# Patient Record
Sex: Male | Born: 1980 | Race: White | Hispanic: Yes | Smoking: Never smoker
Health system: Southern US, Community
[De-identification: ages and names within clinical notes are randomized; demographics above are authoritative.]

## PROBLEM LIST (undated history)

## (undated) DIAGNOSIS — M1990 Unspecified osteoarthritis, unspecified site: Secondary | ICD-10-CM

---

## 2020-10-13 ENCOUNTER — Encounter (HOSPITAL_COMMUNITY): Payer: Self-pay | Admitting: Emergency Medicine

## 2020-10-13 ENCOUNTER — Inpatient Hospital Stay (HOSPITAL_COMMUNITY)
Admission: EM | Admit: 2020-10-13 | Discharge: 2020-10-19 | DRG: 683 | Disposition: A | Discharge status: Home / Self Care | Payer: Self-pay | Attending: Internal Medicine | Admitting: Internal Medicine

## 2020-10-13 ENCOUNTER — Other Ambulatory Visit: Payer: Self-pay

## 2020-10-13 ENCOUNTER — Emergency Department (HOSPITAL_COMMUNITY): Payer: Self-pay

## 2020-10-13 DIAGNOSIS — R609 Edema, unspecified: Secondary | ICD-10-CM

## 2020-10-13 DIAGNOSIS — D72829 Elevated white blood cell count, unspecified: Secondary | ICD-10-CM | POA: Diagnosis not present

## 2020-10-13 DIAGNOSIS — M25562 Pain in left knee: Secondary | ICD-10-CM

## 2020-10-13 DIAGNOSIS — A419 Sepsis, unspecified organism: Secondary | ICD-10-CM

## 2020-10-13 DIAGNOSIS — D509 Iron deficiency anemia, unspecified: Secondary | ICD-10-CM | POA: Diagnosis present

## 2020-10-13 DIAGNOSIS — W3400XA Accidental discharge from unspecified firearms or gun, initial encounter: Secondary | ICD-10-CM

## 2020-10-13 DIAGNOSIS — N179 Acute kidney failure, unspecified: Principal | ICD-10-CM | POA: Diagnosis present

## 2020-10-13 DIAGNOSIS — R739 Hyperglycemia, unspecified: Secondary | ICD-10-CM | POA: Diagnosis present

## 2020-10-13 DIAGNOSIS — Z23 Encounter for immunization: Secondary | ICD-10-CM

## 2020-10-13 DIAGNOSIS — S71142A Puncture wound with foreign body, left thigh, initial encounter: Secondary | ICD-10-CM | POA: Diagnosis present

## 2020-10-13 DIAGNOSIS — M6282 Rhabdomyolysis: Secondary | ICD-10-CM | POA: Diagnosis present

## 2020-10-13 DIAGNOSIS — M1990 Unspecified osteoarthritis, unspecified site: Secondary | ICD-10-CM | POA: Diagnosis present

## 2020-10-13 DIAGNOSIS — Z20822 Contact with and (suspected) exposure to covid-19: Secondary | ICD-10-CM | POA: Diagnosis present

## 2020-10-13 DIAGNOSIS — S81832A Puncture wound without foreign body, left lower leg, initial encounter: Secondary | ICD-10-CM | POA: Diagnosis present

## 2020-10-13 DIAGNOSIS — Z791 Long term (current) use of non-steroidal anti-inflammatories (NSAID): Secondary | ICD-10-CM

## 2020-10-13 DIAGNOSIS — E860 Dehydration: Secondary | ICD-10-CM | POA: Diagnosis present

## 2020-10-13 DIAGNOSIS — Z6830 Body mass index (BMI) 30.0-30.9, adult: Secondary | ICD-10-CM

## 2020-10-13 DIAGNOSIS — E872 Acidosis: Secondary | ICD-10-CM | POA: Diagnosis present

## 2020-10-13 DIAGNOSIS — E876 Hypokalemia: Secondary | ICD-10-CM | POA: Diagnosis present

## 2020-10-13 HISTORY — DX: Unspecified osteoarthritis, unspecified site: M19.90

## 2020-10-13 MED ORDER — ONDANSETRON HCL 4 MG/2ML IJ SOLN
4.0000 mg | Freq: Once | INTRAMUSCULAR | Status: AC
  Administered 2020-10-14: 4 mg via INTRAVENOUS
  Filled 2020-10-13: qty 2

## 2020-10-13 MED ORDER — TETANUS-DIPHTH-ACELL PERTUSSIS 5-2.5-18.5 LF-MCG/0.5 IM SUSY
0.5000 mL | PREFILLED_SYRINGE | Freq: Once | INTRAMUSCULAR | Status: AC
  Administered 2020-10-14: 0.5 mL via INTRAMUSCULAR
  Filled 2020-10-13: qty 0.5

## 2020-10-13 MED ORDER — SODIUM CHLORIDE 0.9 % IV BOLUS
500.0000 mL | Freq: Once | INTRAVENOUS | Status: AC
  Administered 2020-10-13: 500 mL via INTRAVENOUS

## 2020-10-13 MED ORDER — CEFAZOLIN SODIUM-DEXTROSE 1-4 GM/50ML-% IV SOLN
1.0000 g | Freq: Once | INTRAVENOUS | Status: AC
  Administered 2020-10-14: 1 g via INTRAVENOUS
  Filled 2020-10-13: qty 50

## 2020-10-13 MED ORDER — FENTANYL CITRATE PF 50 MCG/ML IJ SOSY
100.0000 ug | PREFILLED_SYRINGE | INTRAMUSCULAR | Status: DC | PRN
  Filled 2020-10-13: qty 2
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
## 2020-10-13 NOTE — Progress Notes (Signed)
Chaplain checked in for level.  Chaplain available to follow-up as needed.    10/13/20 2300  Clinical Encounter Type  Visited With Patient not available  Visit Type Initial

## 2020-10-14 ENCOUNTER — Emergency Department (HOSPITAL_COMMUNITY): Payer: Self-pay

## 2020-10-14 ENCOUNTER — Encounter (HOSPITAL_COMMUNITY): Payer: Self-pay | Admitting: Internal Medicine

## 2020-10-14 ENCOUNTER — Inpatient Hospital Stay (HOSPITAL_COMMUNITY): Payer: Self-pay

## 2020-10-14 DIAGNOSIS — N179 Acute kidney failure, unspecified: Secondary | ICD-10-CM | POA: Diagnosis present

## 2020-10-14 LAB — CBC WITH DIFFERENTIAL/PLATELET
Abs Immature Granulocytes: 0.1 10*3/uL — ABNORMAL HIGH (ref 0.00–0.07)
Basophils Absolute: 0.1 10*3/uL (ref 0.0–0.1)
Basophils Relative: 1 %
Eosinophils Absolute: 0.6 10*3/uL — ABNORMAL HIGH (ref 0.0–0.5)
Eosinophils Relative: 4 %
HCT: 27.7 % — ABNORMAL LOW (ref 39.0–52.0)
Hemoglobin: 9.3 g/dL — ABNORMAL LOW (ref 13.0–17.0)
Immature Granulocytes: 1 %
Lymphocytes Relative: 42 %
Lymphs Abs: 7.1 10*3/uL — ABNORMAL HIGH (ref 0.7–4.0)
MCH: 31.3 pg (ref 26.0–34.0)
MCHC: 33.6 g/dL (ref 30.0–36.0)
MCV: 93.3 fL (ref 80.0–100.0)
Monocytes Absolute: 1.4 10*3/uL — ABNORMAL HIGH (ref 0.1–1.0)
Monocytes Relative: 8 %
Neutro Abs: 7.5 10*3/uL (ref 1.7–7.7)
Neutrophils Relative %: 44 %
Platelets: 358 10*3/uL (ref 150–400)
RBC: 2.97 MIL/uL — ABNORMAL LOW (ref 4.22–5.81)
RDW: 12.7 % (ref 11.5–15.5)
WBC: 16.7 10*3/uL — ABNORMAL HIGH (ref 4.0–10.5)
nRBC: 0 % (ref 0.0–0.2)

## 2020-10-14 LAB — COMPREHENSIVE METABOLIC PANEL
ALT: 26 U/L (ref 0–44)
ALT: 26 U/L (ref 0–44)
AST: 45 U/L — ABNORMAL HIGH (ref 15–41)
AST: 47 U/L — ABNORMAL HIGH (ref 15–41)
Albumin: 3.5 g/dL (ref 3.5–5.0)
Albumin: 3.1 g/dL — ABNORMAL LOW (ref 3.5–5.0)
Alkaline Phosphatase: 64 U/L (ref 38–126)
Alkaline Phosphatase: 65 U/L (ref 38–126)
Anion gap: 13 (ref 5–15)
Anion gap: 9 (ref 5–15)
BUN: 65 mg/dL — ABNORMAL HIGH (ref 6–20)
BUN: 58 mg/dL — ABNORMAL HIGH (ref 6–20)
CO2: 18 mmol/L — ABNORMAL LOW (ref 22–32)
CO2: 21 mmol/L — ABNORMAL LOW (ref 22–32)
Calcium: 8.8 mg/dL — ABNORMAL LOW (ref 8.9–10.3)
Calcium: 8.7 mg/dL — ABNORMAL LOW (ref 8.9–10.3)
Chloride: 106 mmol/L (ref 98–111)
Chloride: 106 mmol/L (ref 98–111)
Creatinine, Ser: 3.45 mg/dL — ABNORMAL HIGH (ref 0.61–1.24)
Creatinine, Ser: 3.09 mg/dL — ABNORMAL HIGH (ref 0.61–1.24)
GFR, Estimated: 11 mL/min — ABNORMAL LOW (ref >60)
GFR, Estimated: 25 mL/min — ABNORMAL LOW (ref >60)
Glucose, Bld: 131 mg/dL — ABNORMAL HIGH (ref 70–99)
Glucose, Bld: 141 mg/dL — ABNORMAL HIGH (ref 70–99)
Potassium: 3.2 mmol/L — ABNORMAL LOW (ref 3.5–5.1)
Potassium: 3.2 mmol/L — ABNORMAL LOW (ref 3.5–5.1)
Sodium: 137 mmol/L (ref 135–145)
Sodium: 136 mmol/L (ref 135–145)
Total Bilirubin: 0.3 mg/dL (ref 0.3–1.2)
Total Bilirubin: 0.5 mg/dL (ref 0.3–1.2)
Total Protein: 7.8 g/dL (ref 6.5–8.1)
Total Protein: 7 g/dL (ref 6.5–8.1)

## 2020-10-14 LAB — I-STAT CHEM 8, ED
BUN: 79 mg/dL — ABNORMAL HIGH (ref 6–20)
Calcium, Ion: 1.16 mmol/L (ref 1.15–1.40)
Chloride: 108 mmol/L (ref 98–111)
Creatinine, Ser: 3.9 mg/dL — ABNORMAL HIGH (ref 0.61–1.24)
Glucose, Bld: 128 mg/dL — ABNORMAL HIGH (ref 70–99)
HCT: 28 % — ABNORMAL LOW (ref 39.0–52.0)
Hemoglobin: 9.5 g/dL — ABNORMAL LOW (ref 13.0–17.0)
Potassium: 3.2 mmol/L — ABNORMAL LOW (ref 3.5–5.1)
Sodium: 140 mmol/L (ref 135–145)
TCO2: 21 mmol/L — ABNORMAL LOW (ref 22–32)

## 2020-10-14 LAB — URINALYSIS, ROUTINE W REFLEX MICROSCOPIC
Bacteria, UA: NONE SEEN
Bilirubin Urine: NEGATIVE
Glucose, UA: NEGATIVE mg/dL
Ketones, ur: NEGATIVE mg/dL
Leukocytes,Ua: NEGATIVE
Nitrite: NEGATIVE
Protein, ur: NEGATIVE mg/dL
Specific Gravity, Urine: 1.01 (ref 1.005–1.030)
pH: 5 (ref 5.0–8.0)

## 2020-10-14 LAB — IRON AND TIBC
Iron: 26 ug/dL — ABNORMAL LOW (ref 45–182)
Saturation Ratios: 8 % — ABNORMAL LOW (ref 17.9–39.5)
TIBC: 325 ug/dL (ref 250–450)
UIBC: 299 ug/dL

## 2020-10-14 LAB — CBC
HCT: 24.8 % — ABNORMAL LOW (ref 39.0–52.0)
Hemoglobin: 8 g/dL — ABNORMAL LOW (ref 13.0–17.0)
MCH: 30 pg (ref 26.0–34.0)
MCHC: 32.3 g/dL (ref 30.0–36.0)
MCV: 92.9 fL (ref 80.0–100.0)
Platelets: 303 10*3/uL (ref 150–400)
RBC: 2.67 MIL/uL — ABNORMAL LOW (ref 4.22–5.81)
RDW: 12.8 % (ref 11.5–15.5)
WBC: 13.1 10*3/uL — ABNORMAL HIGH (ref 4.0–10.5)
nRBC: 0 % (ref 0.0–0.2)

## 2020-10-14 LAB — SODIUM, URINE, RANDOM: Sodium, Ur: 109 mmol/L

## 2020-10-14 LAB — CK
Total CK: 1018 U/L — ABNORMAL HIGH (ref 49–397)
Total CK: 1024 U/L — ABNORMAL HIGH (ref 49–397)

## 2020-10-14 LAB — RESP PANEL BY RT-PCR (FLU A&B, COVID) ARPGX2
Influenza A by PCR: NEGATIVE
Influenza B by PCR: NEGATIVE
SARS Coronavirus 2 by RT PCR: NEGATIVE

## 2020-10-14 LAB — FERRITIN: Ferritin: 270 ng/mL (ref 24–336)

## 2020-10-14 LAB — OSMOLALITY, URINE: Osmolality, Ur: 420 mOsm/kg (ref 300–900)

## 2020-10-14 LAB — ABO/RH: ABO/RH(D): A POS

## 2020-10-14 LAB — TYPE AND SCREEN
ABO/RH(D): A POS
Antibody Screen: NEGATIVE

## 2020-10-14 LAB — PROTIME-INR
INR: 1 (ref 0.8–1.2)
Prothrombin Time: 12.9 seconds (ref 11.4–15.2)

## 2020-10-14 LAB — HIV ANTIBODY (ROUTINE TESTING W REFLEX): HIV Screen 4th Generation wRfx: NONREACTIVE

## 2020-10-14 LAB — PHOSPHORUS: Phosphorus: 3 mg/dL (ref 2.5–4.6)

## 2020-10-14 LAB — MAGNESIUM: Magnesium: 1.8 mg/dL (ref 1.7–2.4)

## 2020-10-14 MED ORDER — LACTATED RINGERS IV BOLUS
1000.0000 mL | Freq: Once | INTRAVENOUS | Status: AC
  Administered 2020-10-14: 1000 mL via INTRAVENOUS

## 2020-10-14 MED ORDER — HYDROMORPHONE HCL 1 MG/ML IJ SOLN
1.0000 mg | INTRAMUSCULAR | Status: DC | PRN
Start: 2020-10-14 — End: 2020-10-15
  Administered 2020-10-14 – 2020-10-15 (×3): 1 mg via INTRAVENOUS
  Filled 2020-10-14 (×3): qty 1

## 2020-10-14 MED ORDER — LACTATED RINGERS IV SOLN: INTRAVENOUS | Status: AC

## 2020-10-14 MED ORDER — MELATONIN 3 MG PO TABS
3.0000 mg | ORAL_TABLET | Freq: Every evening | ORAL | Status: DC | PRN
  Administered 2020-10-14 – 2020-10-15 (×2): 3 mg via ORAL
  Filled 2020-10-14 (×2): qty 1

## 2020-10-14 MED ORDER — POTASSIUM CHLORIDE CRYS ER 20 MEQ PO TBCR
30.0000 meq | EXTENDED_RELEASE_TABLET | Freq: Once | ORAL | Status: AC
  Administered 2020-10-14: 30 meq via ORAL
  Filled 2020-10-14: qty 1

## 2020-10-14 MED ORDER — ACETAMINOPHEN 325 MG PO TABS
650.0000 mg | ORAL_TABLET | Freq: Four times a day (QID) | ORAL | Status: AC | PRN
  Administered 2020-10-15 (×2): 650 mg via ORAL
  Filled 2020-10-14 (×2): qty 2

## 2020-10-14 MED ORDER — HYDROMORPHONE HCL 1 MG/ML IJ SOLN
1.0000 mg | INTRAMUSCULAR | Status: DC | PRN
  Administered 2020-10-14 (×2): 1 mg via INTRAVENOUS
  Filled 2020-10-14 (×2): qty 1

## 2020-10-14 MED ORDER — POLYETHYLENE GLYCOL 3350 17 G PO PACK: 17.0000 g | PACK | Freq: Every day | ORAL | Status: DC | PRN

## 2020-10-14 MED ORDER — CEFAZOLIN SODIUM-DEXTROSE 1-4 GM/50ML-% IV SOLN
1.0000 g | Freq: Two times a day (BID) | INTRAVENOUS | Status: DC
  Administered 2020-10-14 (×2): 1 g via INTRAVENOUS
  Filled 2020-10-14 (×4): qty 50

## 2020-10-14 MED ORDER — ONDANSETRON HCL 4 MG/2ML IJ SOLN
4.0000 mg | Freq: Four times a day (QID) | INTRAMUSCULAR | Status: DC | PRN
  Filled 2020-10-14: qty 2

## 2020-10-14 MED ORDER — SODIUM CHLORIDE 0.9 % IV BOLUS
2500.0000 mL | Freq: Once | INTRAVENOUS | Status: AC
  Administered 2020-10-14: 2500 mL via INTRAVENOUS
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
## 2020-10-14 NOTE — ED Notes (Signed)
Patient transported to CT scan . 
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
## 2020-10-14 NOTE — Progress Notes (Signed)
Waiting for ED nurse to respond to my questions regarding report for this patient. Have not heard back yet via secure chat. This information was passed along to night shift RN, Crystal and she was added to secure chat conversation.

## 2020-10-14 NOTE — ED Notes (Signed)
Patient eating lucnh at this time, denied any pain or needs at this time.

## 2020-10-15 ENCOUNTER — Inpatient Hospital Stay (HOSPITAL_COMMUNITY): Payer: Self-pay

## 2020-10-15 DIAGNOSIS — W3400XA Accidental discharge from unspecified firearms or gun, initial encounter: Secondary | ICD-10-CM

## 2020-10-15 DIAGNOSIS — A419 Sepsis, unspecified organism: Secondary | ICD-10-CM

## 2020-10-15 DIAGNOSIS — M1990 Unspecified osteoarthritis, unspecified site: Secondary | ICD-10-CM

## 2020-10-15 LAB — CK: Total CK: 943 U/L — ABNORMAL HIGH (ref 49–397)

## 2020-10-15 LAB — SYNOVIAL CELL COUNT + DIFF, W/ CRYSTALS
Crystals, Fluid: NONE SEEN
Eosinophils-Synovial: 0 % (ref 0–1)
Lymphocytes-Synovial Fld: 2 % (ref 0–20)
Monocyte-Macrophage-Synovial Fluid: 3 % — ABNORMAL LOW (ref 50–90)
Neutrophil, Synovial: 95 % — ABNORMAL HIGH (ref 0–25)
WBC, Synovial: 29000 /mm3 — ABNORMAL HIGH (ref 0–200)

## 2020-10-15 LAB — BASIC METABOLIC PANEL
Anion gap: 8 (ref 5–15)
BUN: 44 mg/dL — ABNORMAL HIGH (ref 6–20)
CO2: 24 mmol/L (ref 22–32)
Calcium: 8.7 mg/dL — ABNORMAL LOW (ref 8.9–10.3)
Chloride: 103 mmol/L (ref 98–111)
Creatinine, Ser: 2.69 mg/dL — ABNORMAL HIGH (ref 0.61–1.24)
GFR, Estimated: 30 mL/min — ABNORMAL LOW (ref >60)
Glucose, Bld: 126 mg/dL — ABNORMAL HIGH (ref 70–99)
Potassium: 3.8 mmol/L (ref 3.5–5.1)
Sodium: 135 mmol/L (ref 135–145)

## 2020-10-15 LAB — CBC WITH DIFFERENTIAL/PLATELET
Abs Immature Granulocytes: 0.07 10*3/uL (ref 0.00–0.07)
Basophils Absolute: 0.1 10*3/uL (ref 0.0–0.1)
Basophils Relative: 1 %
Eosinophils Absolute: 0.1 10*3/uL (ref 0.0–0.5)
Eosinophils Relative: 1 %
HCT: 23.3 % — ABNORMAL LOW (ref 39.0–52.0)
Hemoglobin: 7.7 g/dL — ABNORMAL LOW (ref 13.0–17.0)
Immature Granulocytes: 1 %
Lymphocytes Relative: 16 %
Lymphs Abs: 2.2 10*3/uL (ref 0.7–4.0)
MCH: 30.1 pg (ref 26.0–34.0)
MCHC: 33 g/dL (ref 30.0–36.0)
MCV: 91 fL (ref 80.0–100.0)
Monocytes Absolute: 1.6 10*3/uL — ABNORMAL HIGH (ref 0.1–1.0)
Monocytes Relative: 11 %
Neutro Abs: 10.1 10*3/uL — ABNORMAL HIGH (ref 1.7–7.7)
Neutrophils Relative %: 70 %
Platelets: 240 10*3/uL (ref 150–400)
RBC: 2.56 MIL/uL — ABNORMAL LOW (ref 4.22–5.81)
RDW: 13.2 % (ref 11.5–15.5)
WBC: 14.1 10*3/uL — ABNORMAL HIGH (ref 4.0–10.5)
nRBC: 0 % (ref 0.0–0.2)

## 2020-10-15 MED ORDER — VANCOMYCIN HCL 1750 MG/350ML IV SOLN
1750.0000 mg | Freq: Once | INTRAVENOUS | Status: AC
  Administered 2020-10-15: 1750 mg via INTRAVENOUS
  Filled 2020-10-15: qty 350

## 2020-10-15 MED ORDER — VANCOMYCIN HCL 750 MG/150ML IV SOLN: 750.0000 mg | INTRAVENOUS | Status: DC

## 2020-10-15 MED ORDER — SODIUM CHLORIDE 0.9 % IV SOLN: INTRAVENOUS | Status: DC

## 2020-10-15 MED ORDER — VANCOMYCIN HCL 750 MG/150ML IV SOLN
750.0000 mg | INTRAVENOUS | Status: DC
  Administered 2020-10-16: 750 mg via INTRAVENOUS
  Filled 2020-10-15: qty 150

## 2020-10-15 MED ORDER — OXYCODONE HCL 5 MG PO TABS
5.0000 mg | ORAL_TABLET | ORAL | Status: DC | PRN
  Administered 2020-10-15 – 2020-10-17 (×9): 10 mg via ORAL
  Administered 2020-10-18: 5 mg via ORAL
  Administered 2020-10-18: 10 mg via ORAL
  Administered 2020-10-18: 5 mg via ORAL
  Filled 2020-10-15 (×4): qty 2
  Filled 2020-10-15: qty 1
  Filled 2020-10-15 (×4): qty 2
  Filled 2020-10-15: qty 1
  Filled 2020-10-15 (×2): qty 2
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
## 2020-10-15 NOTE — Plan of Care (Signed)
  Problem: Education: Goal: Knowledge of General Education information will improve Description Including pain rating scale, medication(s)/side effects and non-pharmacologic comfort measures Outcome: Progressing   Problem: Health Behavior/Discharge Planning: Goal: Ability to manage health-related needs will improve Outcome: Progressing   
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
## 2020-10-15 NOTE — Progress Notes (Signed)
Dr. Gerri Lins made aware that patient has remained tachycardic in the  120's-160's.

## 2020-10-16 ENCOUNTER — Encounter (HOSPITAL_COMMUNITY): Payer: Self-pay | Admitting: Internal Medicine

## 2020-10-16 DIAGNOSIS — R509 Fever, unspecified: Secondary | ICD-10-CM

## 2020-10-16 LAB — HEMOGLOBIN A1C
Hgb A1c MFr Bld: 5.5 % (ref 4.8–5.6)
Mean Plasma Glucose: 111.15 mg/dL

## 2020-10-16 LAB — CBC WITH DIFFERENTIAL/PLATELET
Abs Immature Granulocytes: 0.12 10*3/uL — ABNORMAL HIGH (ref 0.00–0.07)
Basophils Absolute: 0.1 10*3/uL (ref 0.0–0.1)
Basophils Relative: 0 %
Eosinophils Absolute: 0.1 10*3/uL (ref 0.0–0.5)
Eosinophils Relative: 1 %
HCT: 22.1 % — ABNORMAL LOW (ref 39.0–52.0)
Hemoglobin: 7.3 g/dL — ABNORMAL LOW (ref 13.0–17.0)
Immature Granulocytes: 1 %
Lymphocytes Relative: 23 %
Lymphs Abs: 3.5 10*3/uL (ref 0.7–4.0)
MCH: 30.2 pg (ref 26.0–34.0)
MCHC: 33 g/dL (ref 30.0–36.0)
MCV: 91.3 fL (ref 80.0–100.0)
Monocytes Absolute: 2.1 10*3/uL — ABNORMAL HIGH (ref 0.1–1.0)
Monocytes Relative: 14 %
Neutro Abs: 9.6 10*3/uL — ABNORMAL HIGH (ref 1.7–7.7)
Neutrophils Relative %: 61 %
Platelets: 186 10*3/uL (ref 150–400)
RBC: 2.42 MIL/uL — ABNORMAL LOW (ref 4.22–5.81)
RDW: 13.2 % (ref 11.5–15.5)
WBC: 15.5 10*3/uL — ABNORMAL HIGH (ref 4.0–10.5)
nRBC: 0 % (ref 0.0–0.2)

## 2020-10-16 LAB — BASIC METABOLIC PANEL
Anion gap: 10 (ref 5–15)
BUN: 35 mg/dL — ABNORMAL HIGH (ref 6–20)
CO2: 22 mmol/L (ref 22–32)
Calcium: 8.3 mg/dL — ABNORMAL LOW (ref 8.9–10.3)
Chloride: 100 mmol/L (ref 98–111)
Creatinine, Ser: 2.81 mg/dL — ABNORMAL HIGH (ref 0.61–1.24)
GFR, Estimated: 28 mL/min — ABNORMAL LOW (ref >60)
Glucose, Bld: 120 mg/dL — ABNORMAL HIGH (ref 70–99)
Potassium: 3.3 mmol/L — ABNORMAL LOW (ref 3.5–5.1)
Sodium: 132 mmol/L — ABNORMAL LOW (ref 135–145)

## 2020-10-16 LAB — URIC ACID: Uric Acid, Serum: 10.8 mg/dL — ABNORMAL HIGH (ref 3.7–8.6)

## 2020-10-16 LAB — CK: Total CK: 747 U/L — ABNORMAL HIGH (ref 49–397)

## 2020-10-16 MED ORDER — ACETAMINOPHEN 325 MG PO TABS
650.0000 mg | ORAL_TABLET | Freq: Four times a day (QID) | ORAL | Status: DC | PRN
  Administered 2020-10-16 (×2): 650 mg via ORAL
  Filled 2020-10-16 (×2): qty 2

## 2020-10-16 MED ORDER — POTASSIUM CHLORIDE CRYS ER 20 MEQ PO TBCR
40.0000 meq | EXTENDED_RELEASE_TABLET | Freq: Every day | ORAL | Status: DC
  Administered 2020-10-16 – 2020-10-19 (×4): 40 meq via ORAL
  Filled 2020-10-16 (×4): qty 2

## 2020-10-16 MED ORDER — HYDROMORPHONE HCL 1 MG/ML IJ SOLN
1.0000 mg | Freq: Once | INTRAMUSCULAR | Status: AC
Start: 2020-10-16 — End: 2020-10-16
  Administered 2020-10-16: 1 mg via INTRAVENOUS
  Filled 2020-10-16: qty 1
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
## 2020-10-16 NOTE — Progress Notes (Signed)
HOSPITAL MEDICINE OVERNIGHT EVENT NOTE    Notified by nursing that patient is exhibiting another fever of 101.8 F.  This is the first fever since patient exhibited multiple fevers during the previous evening shift.   Patient continues to exhibit other SIRS criteria including ongoing leukocytosis and tachycardia.  Daytime notes reviewed, cultures remain negative, infectious disease following with no definitive source of infection identified.  Patient is currently not on antibiotics.  Patient is currently hemodynamically stable.  Nursing providing patient with Tylenol and will continue to monitor closely.  Marinda Elk  MD [facility]

## 2020-10-16 NOTE — TOC CAGE-AID Note (Signed)
Transition of Care [facility]) - CAGE-AID Screening   Patient Details  Name: Timothy Snow MRN: [id number] Date of Birth: [birth year]  [facility] CM/SW Contact:    Lossie Faes Tarpley-Carter, LCSWA Phone Number: 10/16/2020, 3:46 PM   Clinical Narrative: Pt participated in Cage-Aid.  Pt stated he does nnot use substance, but drinks ETOH socially.  Pt stated he is quitting. Pt was offered resources, and accepted.    Jhanae Jaskowiak Tarpley-Carter, MSW, LCSW-A Pronouns:  She/Her/Hers [facility]Transitions of Care Clinical Social Worker Direct Number:  [phone] [email or web address]   CAGE-AID Screening:    Have You Ever Felt You Ought to Cut Down on Your Drinking or Drug Use?: Yes (Pt stated he drinks ETOH socially.  And is quiting.) Have People Annoyed You By [employer] Your Drinking Or Drug Use?: No Have You Felt Bad Or Guilty About Your Drinking Or Drug Use?: Yes Have You Ever Had a Drink or Used Drugs First Thing In The Morning to Steady Your Nerves or to Get Rid of a Hangover?: No CAGE-AID Score: 2  Substance Abuse Education Offered: Yes  Substance abuse interventions: Transport planner

## 2020-10-16 NOTE — Progress Notes (Signed)
[facility]   Reason for visit: Follow up on leukocytosis  Interval History: fever yesterday to 102, WBC 15.5.  He continues to complain of right elbow pain and right leg pain.  To orthopedics, he reported a history of a similar problem, but to me he denied any history of this.  No significant complaints of his left thigh.    Physical Exam: Constitutional:  Vitals:   10/16/20 0738 10/16/20 1241  BP: 127/83 126/65  Pulse:    Resp: 19 19  Temp: 99.3 F (37.4 C) 99.3 F (37.4 C)  SpO2: 95% 96%   patient appears in NAD Respiratory: Normal respiratory effort; CTA B Cardiovascular: RRR MS: right elbow with warmth, edema; right leg with warmth, minimal right knee effusion Left leg with no tenderness, no warmth, no effusion  Review of Systems: Constitutional: positive for fevers Gastrointestinal: negative for diarrhea Integument/breast: negative for rash  Lab Results  Component Value Date   WBC 15.5 (H) 10/16/2020   HGB 7.3 (L) 10/16/2020   HCT 22.1 (L) 10/16/2020   MCV 91.3 10/16/2020   PLT 186 10/16/2020    Lab Results  Component Value Date   CREATININE 2.81 (H) 10/16/2020   BUN 35 (H) 10/16/2020   NA 132 (L) 10/16/2020   K 3.3 (L) 10/16/2020   CL 100 10/16/2020   CO2 22 10/16/2020    Lab Results  Component Value Date   ALT 26 10/14/2020   AST 47 (H) 10/14/2020   ALKPHOS 65 10/14/2020     Microbiology: Recent Results (from the past 240 hour(s))  Resp Panel by RT-PCR (Flu A&B, Covid) Nasopharyngeal Swab     Status: None   Collection Time: 10/13/20 11:48 PM   Specimen: Nasopharyngeal Swab; Nasopharyngeal(NP) swabs in vial transport medium  Result Value Ref Range Status   SARS Coronavirus 2 by RT PCR NEGATIVE NEGATIVE Final    Comment: (NOTE) SARS-CoV-2 target nucleic acids are NOT DETECTED.  The SARS-CoV-2 RNA is generally detectable in upper respiratory specimens during the acute phase of infection. The lowest concentration of  SARS-CoV-2 viral copies this assay can detect is 138 copies/mL. A negative result does not preclude SARS-Cov-2 infection and should not be used as the sole basis for treatment or other patient management decisions. A negative result may occur with  improper specimen collection/handling, submission of specimen other than nasopharyngeal swab, presence of viral mutation(s) within the areas targeted by this assay, and inadequate number of viral copies(<138 copies/mL). A negative result must be combined with clinical observations, patient history, and epidemiological information. The expected result is Negative.  Fact Sheet for Patients:  BloggerCourse.com  Fact Sheet for Healthcare Providers:  SeriousBroker.it  This test is no t yet approved or cleared by the [place] FDA and  has been authorized for detection and/or diagnosis of SARS-CoV-2 by FDA under an Emergency Use Authorization (EUA). This EUA will remain  in effect (meaning this test can be used) for the duration of the COVID-19 declaration under Section 564(b)(1) of the Act, 21 U.S.C.section 360bbb-3(b)(1), unless the authorization is terminated  or revoked sooner.       Influenza A by PCR NEGATIVE NEGATIVE Final   Influenza B by PCR NEGATIVE NEGATIVE Final    Comment: (NOTE) The Xpert Xpress SARS-CoV-2/FLU/RSV plus assay is intended as an aid in the diagnosis of influenza from Nasopharyngeal swab specimens and should not be used as a sole basis for treatment. Nasal washings and aspirates are unacceptable for Xpert  Xpress SARS-CoV-2/FLU/RSV testing.  Fact Sheet for Patients: BloggerCourse.com  Fact Sheet for Healthcare Providers: SeriousBroker.it  This test is not yet approved or cleared by the [place] FDA and has been authorized for detection and/or diagnosis of SARS-CoV-2 by FDA under an Emergency Use  Authorization (EUA). This EUA will remain in effect (meaning this test can be used) for the duration of the COVID-19 declaration under Section 564(b)(1) of the Act, 21 U.S.C. section 360bbb-3(b)(1), unless the authorization is terminated or revoked.  Performed at [facility], [address], [place], [place] [address]   Culture, blood (routine x 2)     Status: None (Preliminary result)   Collection Time: 10/15/20  8:35 AM   Specimen: BLOOD RIGHT HAND  Result Value Ref Range Status   Specimen Description BLOOD RIGHT HAND  Final   Special Requests   Final    BOTTLES DRAWN AEROBIC AND ANAEROBIC Blood Culture adequate volume   Culture   Final    NO GROWTH 1 DAY Performed at [facility], [address], [place], [place] [address]    Report Status PENDING  Incomplete  Culture, blood (routine x 2)     Status: None (Preliminary result)   Collection Time: 10/15/20  8:43 AM   Specimen: BLOOD LEFT HAND  Result Value Ref Range Status   Specimen Description BLOOD LEFT HAND  Final   Special Requests   Final    BOTTLES DRAWN AEROBIC ONLY Blood Culture adequate volume   Culture   Final    NO GROWTH 1 DAY Performed at [facility], [address] [place], [place] [address]    Report Status PENDING  Incomplete  Aerobic/Anaerobic Culture w Gram Stain (surgical/deep wound)     Status: None (Preliminary result)   Collection Time: 10/15/20 12:19 PM   Specimen: Synovium  Result Value Ref Range Status   Specimen Description SYNOVIAL  Final   Special Requests RIGHT KNEE  Final   Gram Stain   Final    WBC PRESENT,BOTH PMN AND MONONUCLEAR NO ORGANISMS SEEN CYTOSPIN SMEAR Performed at [facility], [address] [place], [place] [address]    Culture PENDING  Incomplete   Report Status PENDING  Incomplete    Impression/Plan:  1. Fever - no positive blood cultures to date.  Left leg with no clinical concerns. Some leukocytosis.  No signs of active infection noted.  I  will stop antibiotics Fever and leukocytosis most likely from his right sided inflammatory process, which reportedly he has had before.  No crystals noted though but ? If steroids may be of benefit.    2.  Right elbow and leg swelling with pain - unclear etiology and followed by orthopedics.  No infection from knee aspiration apparent.   As above, seems to be reactive.. Uric acid elevated but no crystals.    3.  Renal insufficiency - creat trending down and down to 2.81.  has mild rhabdomyolysis with a CK that is trending down, but seems out of proportion to his renal failure.  Denies any significant NSAID use.  Nephrology following.
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
## 2020-10-17 LAB — PATHOLOGIST SMEAR REVIEW

## 2020-10-17 LAB — BASIC METABOLIC PANEL
Anion gap: 13 (ref 5–15)
BUN: 43 mg/dL — ABNORMAL HIGH (ref 6–20)
CO2: 22 mmol/L (ref 22–32)
Calcium: 8.8 mg/dL — ABNORMAL LOW (ref 8.9–10.3)
Chloride: 95 mmol/L — ABNORMAL LOW (ref 98–111)
Creatinine, Ser: 3.03 mg/dL — ABNORMAL HIGH (ref 0.61–1.24)
GFR, Estimated: 26 mL/min — ABNORMAL LOW (ref >60)
Glucose, Bld: 113 mg/dL — ABNORMAL HIGH (ref 70–99)
Potassium: 3.6 mmol/L (ref 3.5–5.1)
Sodium: 130 mmol/L — ABNORMAL LOW (ref 135–145)

## 2020-10-17 LAB — CBC
HCT: 22.5 % — ABNORMAL LOW (ref 39.0–52.0)
Hemoglobin: 7.6 g/dL — ABNORMAL LOW (ref 13.0–17.0)
MCH: 29.9 pg (ref 26.0–34.0)
MCHC: 33.8 g/dL (ref 30.0–36.0)
MCV: 88.6 fL (ref 80.0–100.0)
Platelets: 175 10*3/uL (ref 150–400)
RBC: 2.54 MIL/uL — ABNORMAL LOW (ref 4.22–5.81)
RDW: 12.7 % (ref 11.5–15.5)
WBC: 15 10*3/uL — ABNORMAL HIGH (ref 4.0–10.5)
nRBC: 0 % (ref 0.0–0.2)

## 2020-10-17 LAB — CK: Total CK: 762 U/L — ABNORMAL HIGH (ref 49–397)

## 2020-10-17 MED ORDER — HYDROCODONE-ACETAMINOPHEN 5-325 MG PO TABS
1.0000 | ORAL_TABLET | Freq: Four times a day (QID) | ORAL | Status: DC | PRN
  Administered 2020-10-17 – 2020-10-18 (×2): 2 via ORAL
  Filled 2020-10-17 (×2): qty 2

## 2020-10-17 MED ORDER — METHYLPREDNISOLONE SODIUM SUCC 125 MG IJ SOLR
125.0000 mg | Freq: Once | INTRAMUSCULAR | Status: AC
  Administered 2020-10-17: 125 mg via INTRAVENOUS
  Filled 2020-10-17: qty 2

## 2020-10-17 MED ORDER — PREDNISONE 20 MG PO TABS
60.0000 mg | ORAL_TABLET | Freq: Once | ORAL | Status: AC
  Administered 2020-10-18: 60 mg via ORAL
  Filled 2020-10-17: qty 3
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
## 2020-10-17 NOTE — Progress Notes (Signed)
Pt oob once today & ambulated to bathroom with use of walker & standby assist.

## 2020-10-17 NOTE — Progress Notes (Signed)
[facility]   Reason for visit: Follow up on fever  Interval History: continued complaints of right elbow and right leg pain, particularly at the joints.  Tmax 101.8, WBC 15.     Physical Exam: Constitutional:  Vitals:   10/17/20 0356 10/17/20 0848  BP: 120/73 119/71  Pulse:    Resp: 14 13  Temp: 100 F (37.8 C) 98.9 F (37.2 C)  SpO2: 98% 97%   patient appears in NAD Respiratory: Normal respiratory effort; CTA B Cardiovascular: RRR GI: soft, nt, nd MS: right elbow with decreased warmth, decreased swelling  Review of Systems: Constitutional: negative for fevers and chills Gastrointestinal: negative for nausea and diarrhea Integument/breast: negative for rash  Lab Results  Component Value Date   WBC 15.0 (H) 10/17/2020   HGB 7.6 (L) 10/17/2020   HCT 22.5 (L) 10/17/2020   MCV 88.6 10/17/2020   PLT 175 10/17/2020    Lab Results  Component Value Date   CREATININE 3.03 (H) 10/17/2020   BUN 43 (H) 10/17/2020   NA 130 (L) 10/17/2020   K 3.6 10/17/2020   CL 95 (L) 10/17/2020   CO2 22 10/17/2020    Lab Results  Component Value Date   ALT 26 10/14/2020   AST 47 (H) 10/14/2020   ALKPHOS 65 10/14/2020     Microbiology: Recent Results (from the past 240 hour(s))  Resp Panel by RT-PCR (Flu A&B, Covid) Nasopharyngeal Swab     Status: None   Collection Time: 10/13/20 11:48 PM   Specimen: Nasopharyngeal Swab; Nasopharyngeal(NP) swabs in vial transport medium  Result Value Ref Range Status   SARS Coronavirus 2 by RT PCR NEGATIVE NEGATIVE Final    Comment: (NOTE) SARS-CoV-2 target nucleic acids are NOT DETECTED.  The SARS-CoV-2 RNA is generally detectable in upper respiratory specimens during the acute phase of infection. The lowest concentration of SARS-CoV-2 viral copies this assay can detect is 138 copies/mL. A negative result does not preclude SARS-Cov-2 infection and should not be used as the sole basis for treatment or other  patient management decisions. A negative result may occur with  improper specimen collection/handling, submission of specimen other than nasopharyngeal swab, presence of viral mutation(s) within the areas targeted by this assay, and inadequate number of viral copies(<138 copies/mL). A negative result must be combined with clinical observations, patient history, and epidemiological information. The expected result is Negative.  Fact Sheet for Patients:  BloggerCourse.com  Fact Sheet for Healthcare Providers:  SeriousBroker.it  This test is no t yet approved or cleared by the [place] FDA and  has been authorized for detection and/or diagnosis of SARS-CoV-2 by FDA under an Emergency Use Authorization (EUA). This EUA will remain  in effect (meaning this test can be used) for the duration of the COVID-19 declaration under Section 564(b)(1) of the Act, 21 U.S.C.section 360bbb-3(b)(1), unless the authorization is terminated  or revoked sooner.       Influenza A by PCR NEGATIVE NEGATIVE Final   Influenza B by PCR NEGATIVE NEGATIVE Final    Comment: (NOTE) The Xpert Xpress SARS-CoV-2/FLU/RSV plus assay is intended as an aid in the diagnosis of influenza from Nasopharyngeal swab specimens and should not be used as a sole basis for treatment. Nasal washings and aspirates are unacceptable for Xpert Xpress SARS-CoV-2/FLU/RSV testing.  Fact Sheet for Patients: BloggerCourse.com  Fact Sheet for Healthcare Providers: SeriousBroker.it  This test is not yet approved or cleared by the [place] and has been authorized for  detection and/or diagnosis of SARS-CoV-2 by FDA under an Emergency Use Authorization (EUA). This EUA will remain in effect (meaning this test can be used) for the duration of the COVID-19 declaration under Section 564(b)(1) of the Act, 21 U.S.C. section  360bbb-3(b)(1), unless the authorization is terminated or revoked.  Performed at [facility], [address] [place], [place] [address]   Culture, blood (routine x 2)     Status: None (Preliminary result)   Collection Time: 10/15/20  8:35 AM   Specimen: BLOOD RIGHT HAND  Result Value Ref Range Status   Specimen Description BLOOD RIGHT HAND  Final   Special Requests   Final    BOTTLES DRAWN AEROBIC AND ANAEROBIC Blood Culture adequate volume   Culture   Final    NO GROWTH 2 DAYS Performed at [facility], [address], [place], [place] [address]    Report Status PENDING  Incomplete  Culture, blood (routine x 2)     Status: None (Preliminary result)   Collection Time: 10/15/20  8:43 AM   Specimen: BLOOD LEFT HAND  Result Value Ref Range Status   Specimen Description BLOOD LEFT HAND  Final   Special Requests   Final    BOTTLES DRAWN AEROBIC ONLY Blood Culture adequate volume   Culture   Final    NO GROWTH 2 DAYS Performed at [facility], [address] [place], [place] [address]    Report Status PENDING  Incomplete  Aerobic/Anaerobic Culture w Gram Stain (surgical/deep wound)     Status: None (Preliminary result)   Collection Time: 10/15/20 12:19 PM   Specimen: Synovium  Result Value Ref Range Status   Specimen Description SYNOVIAL  Final   Special Requests RIGHT KNEE  Final   Gram Stain   Final    WBC PRESENT,BOTH PMN AND MONONUCLEAR NO ORGANISMS SEEN CYTOSPIN SMEAR    Culture   Final    NO GROWTH 2 DAYS Performed at [facility], [address], [place], [place] [address]    Report Status PENDING  Incomplete    Impression/Plan:  1. Fever - persistent fever but mild and he has no new symptoms. No infectious etiology identified.  No antibiotics indicated. Most likely related to inflammatory process of his right extremities.   2.  Right arm and leg swelling - appears to a bit improved.  Discussed with Dr. Gerri Lins and to give a dose of  solumedrol and a taper steroid dose at discharge.    3.  Renal insufficiency - persistent with a creat stable at 3.03.  appears to be chronic in nature.  Will need to continue follow up with nephrology.
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
## 2020-10-18 DIAGNOSIS — M25562 Pain in left knee: Secondary | ICD-10-CM

## 2020-10-18 LAB — URINALYSIS, ROUTINE W REFLEX MICROSCOPIC
Bacteria, UA: NONE SEEN
Bilirubin Urine: NEGATIVE
Glucose, UA: NEGATIVE mg/dL
Ketones, ur: NEGATIVE mg/dL
Leukocytes,Ua: NEGATIVE
Nitrite: NEGATIVE
Protein, ur: NEGATIVE mg/dL
Specific Gravity, Urine: 1.01 (ref 1.005–1.030)
pH: 5 (ref 5.0–8.0)

## 2020-10-18 MED ORDER — PREDNISONE 20 MG PO TABS
40.0000 mg | ORAL_TABLET | Freq: Every day | ORAL | Status: DC
  Administered 2020-10-19: 40 mg via ORAL
  Filled 2020-10-18: qty 2
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
## 2020-10-18 NOTE — Progress Notes (Signed)
Orthopedic Tech Progress Note Patient Details:  Timothy Snow 10-04-1980 [id number]  Patient ID: Timothy Snow, male   DOB: August 05, 1980, 40 y.o.   MRN: [id number]  Timothy Snow 10/18/2020, 1:44 PM
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
## 2020-10-19 ENCOUNTER — Other Ambulatory Visit (HOSPITAL_COMMUNITY): Payer: Self-pay

## 2020-10-19 MED ORDER — PREDNISONE 10 MG PO TABS
ORAL_TABLET | ORAL | 0 refills | Status: DC
  Filled 2020-10-19: qty 10, 4d supply, fill #0

## 2020-10-19 MED ORDER — DARBEPOETIN ALFA 100 MCG/0.5ML IJ SOSY
100.0000 ug | PREFILLED_SYRINGE | Freq: Once | INTRAMUSCULAR | Status: AC
  Administered 2020-10-19: 100 ug via SUBCUTANEOUS
  Filled 2020-10-19: qty 0.5

## 2020-10-19 MED ORDER — SODIUM CHLORIDE 0.9 % IV SOLN
510.0000 mg | Freq: Once | INTRAVENOUS | Status: AC
  Administered 2020-10-19: 510 mg via INTRAVENOUS
  Filled 2020-10-19: qty 17

## 2020-10-19 MED ORDER — OXYCODONE HCL 5 MG PO TABS
5.0000 mg | ORAL_TABLET | Freq: Four times a day (QID) | ORAL | 0 refills | Status: AC | PRN
  Filled 2020-10-19: qty 12, 3d supply, fill #0
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
## 2020-10-19 NOTE — Plan of Care (Signed)

## 2020-10-20 LAB — CULTURE, BLOOD (ROUTINE X 2)
Culture: NO GROWTH
Culture: NO GROWTH
Special Requests: ADEQUATE
Special Requests: ADEQUATE

## 2020-10-20 LAB — AEROBIC/ANAEROBIC CULTURE W GRAM STAIN (SURGICAL/DEEP WOUND): Culture: NO GROWTH

## 2020-11-07 ENCOUNTER — Ambulatory Visit: Payer: Self-pay | Attending: Internal Medicine | Admitting: Physical Therapy

## 2020-11-22 ENCOUNTER — Inpatient Hospital Stay (INDEPENDENT_AMBULATORY_CARE_PROVIDER_SITE_OTHER): Payer: Self-pay | Admitting: Primary Care

## 2021-02-26 ENCOUNTER — Encounter (HOSPITAL_COMMUNITY): Payer: Self-pay

## 2021-02-26 ENCOUNTER — Other Ambulatory Visit: Payer: Self-pay

## 2021-02-26 ENCOUNTER — Emergency Department (HOSPITAL_COMMUNITY): Payer: Self-pay

## 2021-02-26 ENCOUNTER — Emergency Department (HOSPITAL_COMMUNITY)
Admission: EM | Admit: 2021-02-26 | Discharge: 2021-02-26 | Disposition: A | Discharge status: Home / Self Care | Payer: Self-pay | Attending: Emergency Medicine | Admitting: Emergency Medicine

## 2021-02-26 ENCOUNTER — Emergency Department (HOSPITAL_BASED_OUTPATIENT_CLINIC_OR_DEPARTMENT_OTHER): Payer: Self-pay

## 2021-02-26 DIAGNOSIS — M79605 Pain in left leg: Secondary | ICD-10-CM

## 2021-02-26 DIAGNOSIS — L03116 Cellulitis of left lower limb: Secondary | ICD-10-CM

## 2021-02-26 DIAGNOSIS — M25551 Pain in right hip: Secondary | ICD-10-CM | POA: Insufficient documentation

## 2021-02-26 DIAGNOSIS — L853 Xerosis cutis: Secondary | ICD-10-CM

## 2021-02-26 DIAGNOSIS — M7989 Other specified soft tissue disorders: Secondary | ICD-10-CM | POA: Insufficient documentation

## 2021-02-26 LAB — COMPREHENSIVE METABOLIC PANEL
ALT: 59 U/L — ABNORMAL HIGH (ref 0–44)
AST: 69 U/L — ABNORMAL HIGH (ref 15–41)
Albumin: 3.6 g/dL (ref 3.5–5.0)
Alkaline Phosphatase: 77 U/L (ref 38–126)
Anion gap: 13 (ref 5–15)
BUN: 30 mg/dL — ABNORMAL HIGH (ref 6–20)
CO2: 20 mmol/L — ABNORMAL LOW (ref 22–32)
Calcium: 8.8 mg/dL — ABNORMAL LOW (ref 8.9–10.3)
Chloride: 106 mmol/L (ref 98–111)
Creatinine, Ser: 2.25 mg/dL — ABNORMAL HIGH (ref 0.61–1.24)
GFR, Estimated: 37 mL/min — ABNORMAL LOW (ref >60)
Glucose, Bld: 87 mg/dL (ref 70–99)
Potassium: 4.7 mmol/L (ref 3.5–5.1)
Sodium: 139 mmol/L (ref 135–145)
Total Bilirubin: 0.5 mg/dL (ref 0.3–1.2)
Total Protein: 8.1 g/dL (ref 6.5–8.1)

## 2021-02-26 LAB — CBC WITH DIFFERENTIAL/PLATELET
Abs Immature Granulocytes: 0.03 10*3/uL (ref 0.00–0.07)
Basophils Absolute: 0.1 10*3/uL (ref 0.0–0.1)
Basophils Relative: 1 %
Eosinophils Absolute: 0.3 10*3/uL (ref 0.0–0.5)
Eosinophils Relative: 3 %
HCT: 30.9 % — ABNORMAL LOW (ref 39.0–52.0)
Hemoglobin: 10.1 g/dL — ABNORMAL LOW (ref 13.0–17.0)
Immature Granulocytes: 0 %
Lymphocytes Relative: 22 %
Lymphs Abs: 2 10*3/uL (ref 0.7–4.0)
MCH: 30.5 pg (ref 26.0–34.0)
MCHC: 32.7 g/dL (ref 30.0–36.0)
MCV: 93.4 fL (ref 80.0–100.0)
Monocytes Absolute: 1 10*3/uL (ref 0.1–1.0)
Monocytes Relative: 10 %
Neutro Abs: 5.8 10*3/uL (ref 1.7–7.7)
Neutrophils Relative %: 64 %
Platelets: 256 10*3/uL (ref 150–400)
RBC: 3.31 MIL/uL — ABNORMAL LOW (ref 4.22–5.81)
RDW: 13.6 % (ref 11.5–15.5)
WBC: 9.2 10*3/uL (ref 4.0–10.5)
nRBC: 0 % (ref 0.0–0.2)

## 2021-02-26 LAB — LACTIC ACID, PLASMA: Lactic Acid, Venous: 1.3 mmol/L (ref 0.5–1.9)

## 2021-02-26 MED ORDER — DOXYCYCLINE HYCLATE 100 MG PO CAPS: 100.0000 mg | ORAL_CAPSULE | Freq: Two times a day (BID) | ORAL | 0 refills | Status: AC

## 2021-02-26 MED ORDER — SODIUM CHLORIDE 0.9 % IV BOLUS
1000.0000 mL | Freq: Once | INTRAVENOUS | Status: AC
  Administered 2021-02-26: 1000 mL via INTRAVENOUS

## 2021-02-26 MED ORDER — DOXYCYCLINE HYCLATE 100 MG PO TABS
100.0000 mg | ORAL_TABLET | Freq: Once | ORAL | Status: AC
  Administered 2021-02-26: 100 mg via ORAL
  Filled 2021-02-26: qty 1

## 2021-02-26 MED ORDER — HYDROCODONE-ACETAMINOPHEN 5-325 MG PO TABS
2.0000 | ORAL_TABLET | Freq: Once | ORAL | Status: AC
  Administered 2021-02-26: 2 via ORAL
  Filled 2021-02-26: qty 2
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
## 2021-02-26 NOTE — Discharge Instructions (Addendum)
Please call the kidney doctor (nephrologist) to follow up in the office for your kidney disease.  You should buy moisterizing skin cream (with aloe) at the pharmacy and use this on your arms and legs twice per day.  You have very dry skin that is causing the itching and flaking.
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
## 2021-02-26 NOTE — Progress Notes (Signed)
LLE venous duplex has been completed.  Preliminary results messaged to Dr. Criss Alvine via secure messenger.   Results can be found under chart review under CV PROC. 02/26/2021 5:17 PM Delaine Hernandez RVT, RDMS

## 2021-05-03 ENCOUNTER — Emergency Department (HOSPITAL_BASED_OUTPATIENT_CLINIC_OR_DEPARTMENT_OTHER): Payer: Self-pay

## 2021-05-03 ENCOUNTER — Emergency Department (HOSPITAL_COMMUNITY)
Admission: EM | Admit: 2021-05-03 | Discharge: 2021-05-03 | Disposition: A | Discharge status: Home / Self Care | Payer: Self-pay | Attending: Emergency Medicine | Admitting: Emergency Medicine

## 2021-05-03 ENCOUNTER — Encounter (HOSPITAL_COMMUNITY): Payer: Self-pay

## 2021-05-03 DIAGNOSIS — M79605 Pain in left leg: Secondary | ICD-10-CM | POA: Insufficient documentation

## 2021-05-03 DIAGNOSIS — Y908 Blood alcohol level of 240 mg/100 ml or more: Secondary | ICD-10-CM | POA: Insufficient documentation

## 2021-05-03 DIAGNOSIS — L538 Other specified erythematous conditions: Secondary | ICD-10-CM

## 2021-05-03 DIAGNOSIS — R5383 Other fatigue: Secondary | ICD-10-CM | POA: Insufficient documentation

## 2021-05-03 DIAGNOSIS — M7989 Other specified soft tissue disorders: Secondary | ICD-10-CM

## 2021-05-03 DIAGNOSIS — R7989 Other specified abnormal findings of blood chemistry: Secondary | ICD-10-CM | POA: Insufficient documentation

## 2021-05-03 DIAGNOSIS — D72829 Elevated white blood cell count, unspecified: Secondary | ICD-10-CM | POA: Insufficient documentation

## 2021-05-03 DIAGNOSIS — F10120 Alcohol abuse with intoxication, uncomplicated: Secondary | ICD-10-CM | POA: Insufficient documentation

## 2021-05-03 LAB — BASIC METABOLIC PANEL
Anion gap: 13 (ref 5–15)
BUN: 33 mg/dL — ABNORMAL HIGH (ref 6–20)
CO2: 21 mmol/L — ABNORMAL LOW (ref 22–32)
Calcium: 8.4 mg/dL — ABNORMAL LOW (ref 8.9–10.3)
Chloride: 110 mmol/L (ref 98–111)
Creatinine, Ser: 2.04 mg/dL — ABNORMAL HIGH (ref 0.61–1.24)
GFR, Estimated: 41 mL/min — ABNORMAL LOW (ref >60)
Glucose, Bld: 97 mg/dL (ref 70–99)
Potassium: 3.3 mmol/L — ABNORMAL LOW (ref 3.5–5.1)
Sodium: 144 mmol/L (ref 135–145)

## 2021-05-03 LAB — CBC WITH DIFFERENTIAL/PLATELET
Abs Immature Granulocytes: 0.03 10*3/uL (ref 0.00–0.07)
Basophils Absolute: 0.1 10*3/uL (ref 0.0–0.1)
Basophils Relative: 1 %
Eosinophils Absolute: 0.6 10*3/uL — ABNORMAL HIGH (ref 0.0–0.5)
Eosinophils Relative: 5 %
HCT: 33.9 % — ABNORMAL LOW (ref 39.0–52.0)
Hemoglobin: 11.4 g/dL — ABNORMAL LOW (ref 13.0–17.0)
Immature Granulocytes: 0 %
Lymphocytes Relative: 28 %
Lymphs Abs: 3.3 10*3/uL (ref 0.7–4.0)
MCH: 30.6 pg (ref 26.0–34.0)
MCHC: 33.6 g/dL (ref 30.0–36.0)
MCV: 90.9 fL (ref 80.0–100.0)
Monocytes Absolute: 0.7 10*3/uL (ref 0.1–1.0)
Monocytes Relative: 6 %
Neutro Abs: 6.8 10*3/uL (ref 1.7–7.7)
Neutrophils Relative %: 60 %
Platelets: 303 10*3/uL (ref 150–400)
RBC: 3.73 MIL/uL — ABNORMAL LOW (ref 4.22–5.81)
RDW: 13.8 % (ref 11.5–15.5)
WBC: 11.5 10*3/uL — ABNORMAL HIGH (ref 4.0–10.5)
nRBC: 0 % (ref 0.0–0.2)

## 2021-05-03 LAB — ETHANOL: Alcohol, Ethyl (B): 419 mg/dL (ref <10)

## 2021-05-03 MED ORDER — SULFAMETHOXAZOLE-TRIMETHOPRIM 800-160 MG PO TABS: 1.0000 | ORAL_TABLET | Freq: Two times a day (BID) | ORAL | 0 refills | Status: AC

## 2021-05-03 MED ORDER — SODIUM CHLORIDE 0.9 % IV SOLN
2.0000 g | Freq: Once | INTRAVENOUS | Status: AC
  Administered 2021-05-03: 2 g via INTRAVENOUS
  Filled 2021-05-03: qty 20

## 2021-05-03 MED ORDER — PREDNISONE 10 MG PO TABS: 20.0000 mg | ORAL_TABLET | Freq: Every day | ORAL | 0 refills | Status: AC

## 2021-05-03 MED ORDER — SODIUM CHLORIDE 0.9 % IV BOLUS
1000.0000 mL | Freq: Once | INTRAVENOUS | Status: AC
  Administered 2021-05-03: 1000 mL via INTRAVENOUS

## 2021-05-03 NOTE — Discharge Instructions (Signed)
Follow-up with a family doctor in the next couple weeks and follow-up with [facility] in the next month ?

## 2021-05-03 NOTE — ED Provider Triage Note (Signed)
Emergency Medicine Provider Triage Evaluation Note ? ?[place] , a 41 y.o. male  was evaluated in triage.  Appears clinically intoxicated.  Pt complains of pain in both legs.  Bilateral leg infection/rash with weeping edema.  Chronic over the last year.  Denies numbness or tingling, or pruritus of the legs.  Also notes spreading to the arms, early stages.  Also with mild excoriations on the abdomen, without abdominal pain.  Last alcohol consumption right before EMS arrived.  Denies fever, chest pain, shortness of breath.  Denies recent drug use. ? ?Review of Systems  ?Positive: As above ?Negative: As above ? ?Physical Exam  ?BP 125/84 (BP Location: Left Arm)   Pulse (!) 121   Temp 98.5 ?F (36.9 ?C) (Oral)   Resp 18   SpO2 95%  ?Gen:   Awake, appears clinically intoxicated ?Resp:  Normal effort, CTAB ?MSK:   Moves extremities without difficulty ?Other:  BIL LEs: weeping wounds with flaking and warmth and edema, appear neurovascularly intact ?UEs: mild similar pattern as LEs but appears much milder ?Abdomen: non-TTP; excoriations noted  ? ?Medical Decision Making  ?Medically screening exam initiated at 2:34 PM.  Appropriate orders placed.  Hervey Escareno Tellis Dossey was informed that the remainder of the evaluation will be completed by another provider, this initial triage assessment does not replace that evaluation, and the importance of remaining in the ED until their evaluation is complete. ? ?Labs ordered ?  ?Prince Rome, PA-C ?[phone] ? ?
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
## 2021-05-03 NOTE — ED Notes (Signed)
I provided reinforced discharge education based off of discharge instructions. Pt acknowledged and understood my education. Pt had no further questions/concerns for provider/myself.  °

## 2021-05-03 NOTE — ED Triage Notes (Signed)
Pt presents with c/o alcohol intoxication and bilateral leg infections, weeping edema on the left leg. Pt reports his last drink was right before EMS arrived. ?
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
## 2021-05-03 NOTE — Progress Notes (Signed)
LLE venous duplex has been completed.  Preliminary results given to Dr. Estell Harpin. ? ? ?Results can be found under chart review under CV PROC. ?05/03/2021 5:53 PM ?Timothy Snow RVT, RDMS ? ?

## 2021-05-03 NOTE — ED Notes (Signed)
Pt provided taxi voucher.
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
